# Patient Record
Sex: Male | Born: 1990 | Race: Black or African American | Hispanic: No | Marital: Married | Smoking: Never smoker
Health system: Southern US, Community
[De-identification: ages and names within clinical notes are randomized; demographics above are authoritative.]

---

## 2012-08-21 ENCOUNTER — Emergency Department: Payer: Self-pay | Admitting: Emergency Medicine

## 2012-08-21 LAB — URINALYSIS, COMPLETE
Bacteria: NONE SEEN
Bilirubin,UR: NEGATIVE
Glucose,UR: NEGATIVE mg/dL (ref 0–75)
Ketone: NEGATIVE
Leukocyte Esterase: NEGATIVE
Nitrite: NEGATIVE
Specific Gravity: 1.019 (ref 1.003–1.030)
Squamous Epithelial: 1
WBC UR: 2 /HPF (ref 0–5)

## 2013-10-24 ENCOUNTER — Emergency Department: Payer: Self-pay | Admitting: Emergency Medicine

## 2015-07-22 ENCOUNTER — Encounter: Payer: Self-pay | Admitting: Emergency Medicine

## 2015-07-22 ENCOUNTER — Emergency Department
Admission: EM | Admit: 2015-07-22 | Discharge: 2015-07-22 | Disposition: A | Discharge status: Home / Self Care | Payer: Self-pay | Attending: Emergency Medicine | Admitting: Emergency Medicine

## 2015-07-22 ENCOUNTER — Emergency Department: Payer: Self-pay

## 2015-07-22 DIAGNOSIS — W1642XA Fall into unspecified water causing other injury, initial encounter: Secondary | ICD-10-CM | POA: Insufficient documentation

## 2015-07-22 DIAGNOSIS — Y998 Other external cause status: Secondary | ICD-10-CM | POA: Insufficient documentation

## 2015-07-22 DIAGNOSIS — S8262XA Displaced fracture of lateral malleolus of left fibula, initial encounter for closed fracture: Secondary | ICD-10-CM | POA: Insufficient documentation

## 2015-07-22 DIAGNOSIS — Y939 Activity, unspecified: Secondary | ICD-10-CM | POA: Insufficient documentation

## 2015-07-22 DIAGNOSIS — Y929 Unspecified place or not applicable: Secondary | ICD-10-CM | POA: Insufficient documentation

## 2015-07-22 MED ORDER — OXYCODONE-ACETAMINOPHEN 5-325 MG PO TABS: 1.0000 | ORAL_TABLET | ORAL | Status: AC | PRN

## 2015-07-22 NOTE — ED Notes (Signed)
C/o right ankle pain x 3 days.

## 2015-07-22 NOTE — ED Provider Notes (Signed)
[facility] Emergency Department Provider Note  ____________________________________________  Time seen: Approximately 10:23 AM  I have reviewed the triage vital signs and the nursing notes.   HISTORY  Chief Complaint Ankle Pain    HPI Lucas Mayo is a 25 y.o. male who states that he fell into a pond about 4 days ago twisted his ankle. Patient complains of swelling and bruising noted to the lateral aspect of his right ankle. Able to bear weight but very methodically. Describes his pain as nonradiating and 7/10 at this time. Has tried ibuprofen and Tylenol over-the-counter with no relief.   History reviewed. No pertinent past medical history.  There are no active problems to display for this patient.   History reviewed. No pertinent past surgical history.  Current Outpatient Rx  Name  Route  Sig  Dispense  Refill  . oxyCODONE-acetaminophen (ROXICET) 5-325 MG tablet   Oral   Take 1-2 tablets by mouth every 4 (four) hours as needed for severe pain.   15 tablet   0     Allergies Review of patient's allergies indicates no known allergies.  No family history on file.  Social History Social History  Substance Use Topics  . Smoking status: Never Smoker   . Smokeless tobacco: None  . Alcohol Use: None    Review of Systems Constitutional: No fever/chills Musculoskeletal: Negative for back pain. Skin: Negative for rash. Neurological: Negative for headaches, focal weakness or numbness.  10-point ROS otherwise negative.  ____________________________________________   PHYSICAL EXAM:  VITAL SIGNS: ED Triage Vitals  Enc Vitals Group     BP 07/22/15 0929 147/76 mmHg     Pulse Rate 07/22/15 0929 78     Resp 07/22/15 0929 15     Temp 07/22/15 0929 98.5 F (36.9 C)     Temp Source 07/22/15 0929 Oral     SpO2 07/22/15 0929 100 %     Weight 07/22/15 0929 168 lb (76.204 kg)     Height 07/22/15 0929  (1.88 m)     Head Cir --       Peak Flow --      Pain Score 07/22/15 0927 7     Pain Loc --      Pain Edu? --      Excl. in GC? --     Constitutional: Alert and oriented. Well appearing and in no acute distress. Cardiovascular: Normal rate, regular rhythm. Grossly normal heart sounds.  Good peripheral circulation. Respiratory: Normal respiratory effort.  No retractions. Lungs CTAB. Musculoskeletal: Positive edema and bruising noted to the lateral aspect of the right ankle. Distally neurovascularly intact. Neurologic:  Normal speech and language. No gross focal neurologic deficits are appreciated. No gait instability. Skin:  Skin is warm, dry and intact. No rash noted. Psychiatric: Mood and affect are normal. Speech and behavior are normal.  ____________________________________________   LABS (all labs ordered are listed, but only abnormal results are displayed)  Labs Reviewed - No data to display ____________________________________________  EKG   ____________________________________________  RADIOLOGY  IMPRESSION: Small ossific fragment distal to the lateral malleolus with severe overlying soft tissue swelling consistent with acute avulsive injury. ____________________________________________   PROCEDURES  Procedure(s) performed: None  Critical Care performed: No  ____________________________________________   INITIAL IMPRESSION / ASSESSMENT AND PLAN / ED COURSE  Pertinent labs & imaging results that were available during my care of the patient were reviewed by me and considered in my medical decision making (see chart for details).  Acute avulsive fracture of the lateral distal medial malleolus. Patient is placed in a posterior splint crutches provided and he is to follow-up with orthopedics on call this week for further orthopedic care. Rx given for Percocet 5/325 and a work excuse given as well. ____________________________________________   FINAL CLINICAL IMPRESSION(S) / ED  DIAGNOSES  Final diagnoses:  Closed low lateral malleolus fracture, left, initial encounter     This chart was dictated using voice recognition software/Dragon. Despite best efforts to proofread, errors can occur which can change the meaning. Any change was purely unintentional.    Evangeline Dakinharles M Maui Ahart, PA-C 07/22/15 1045  Myrna Blazeravid Matthew Schaevitz, MD 07/22/15 1630

## 2015-07-22 NOTE — Discharge Instructions (Signed)
Ankle Fracture °A fracture is a break in a bone. The ankle joint is made up of three bones. These include the lower (distal) sections of your lower leg bones, called the tibia and fibula, along with a bone in your foot, called the talus. Depending on how bad the break is and if more than one ankle joint bone is broken, a cast or splint is used to protect and keep your injured bone from moving while it heals. Sometimes, surgery is required to help the fracture heal properly.  °There are two general types of fractures: °· Stable fracture. This includes a single fracture line through one bone, with no injury to ankle ligaments. A fracture of the talus that does not have any displacement (movement of the bone on either side of the fracture line) is also stable. °· Unstable fracture. This includes more than one fracture line through one or more bones in the ankle joint. It also includes fractures that have displacement of the bone on either side of the fracture line. °CAUSES °· A direct blow to the ankle.   °· Quickly and severely twisting your ankle. °· Trauma, such as a car accident or falling from a significant height. °RISK FACTORS °You may be at a higher risk of ankle fracture if: °· You have certain medical conditions. °· You are involved in high-impact sports. °· You are involved in a high-impact car accident. °SIGNS AND SYMPTOMS  °· Tender and swollen ankle. °· Bruising around the injured ankle. °· Pain on movement of the ankle. °· Difficulty walking or putting weight on the ankle. °· A cold foot below the site of the ankle injury. This can occur if the blood vessels passing through your injured ankle were also damaged. °· Numbness in the foot below the site of the ankle injury. °DIAGNOSIS  °An ankle fracture is usually diagnosed with a physical exam and X-rays. A CT scan may also be required for complex fractures. °TREATMENT  °Stable fractures are treated with a cast or splint and using crutches to avoid putting  weight on your injured ankle. This is followed by an ankle strengthening program. Some patients require a special type of cast, depending on other medical problems they may have. Unstable fractures require surgery to ensure the bones heal properly. Your health care provider will tell you what type of fracture you have and the best treatment for your condition. °HOME CARE INSTRUCTIONS  °· Review correct crutch use with your health care provider and use your crutches as directed. Safe use of crutches is extremely important. Misuse of crutches can cause you to fall or cause injury to nerves in your hands or armpits. °· Do not put weight or pressure on the injured ankle until directed by your health care provider. °· To lessen the swelling, keep the injured leg elevated while sitting or lying down. °· Apply ice to the injured area: °¨ Put ice in a plastic bag. °¨ Place a towel between your cast and the bag. °¨ Leave the ice on for 20 minutes, 2-3 times a day. °· If you have a plaster or fiberglass cast: °¨ Do not try to scratch the skin under the cast with any objects. This can increase your risk of skin infection. °¨ Check the skin around the cast every day. You may put lotion on any red or sore areas. °¨ Keep your cast dry and clean. °· If you have a plaster splint: °¨ Wear the splint as directed. °¨ You may loosen the elastic   around the splint if your toes become numb, tingle, or turn cold or blue. °· Do not put pressure on any part of your cast or splint; it may break. Rest your cast only on a pillow the first 24 hours until it is fully hardened. °· Your cast or splint can be protected during bathing with a plastic bag sealed to your skin with medical tape. Do not lower the cast or splint into water. °· Take medicines as directed by your health care provider. Only take over-the-counter or prescription medicines for pain, discomfort, or fever as directed by your health care provider. °· Do not drive a vehicle until  your health care provider specifically tells you it is safe to do so. °· If your health care provider has given you a follow-up appointment, it is very important to keep that appointment. Not keeping the appointment could result in a chronic or permanent injury, pain, and disability. If you have any problem keeping the appointment, call the facility for assistance. °SEEK MEDICAL CARE IF: °You develop increased swelling or discomfort. °SEEK IMMEDIATE MEDICAL CARE IF:  °· Your cast gets damaged or breaks. °· You have continued severe pain. °· You develop new pain or swelling after the cast was put on. °· Your skin or toenails below the injury turn blue or gray. °· Your skin or toenails below the injury feel cold, numb, or have loss of sensitivity to touch. °· There is a bad smell or pus draining from under the cast. °MAKE SURE YOU:  °· Understand these instructions. °· Will watch your condition. °· Will get help right away if you are not doing well or get worse. °  °This information is not intended to replace advice given to you by your health care provider. Make sure you discuss any questions you have with your health care provider. °  °Document Released: 03/19/2000 Document Revised: 03/27/2013 Document Reviewed: 10/19/2012 °Elsevier Interactive Patient Education ©2016 [employer] ° °Cast or Splint Care °Casts and splints support injured limbs and keep bones from moving while they heal. It is important to care for your cast or splint at home.   °HOME CARE INSTRUCTIONS °· Keep the cast or splint uncovered during the drying period. It can take 24 to 48 hours to dry if it is made of plaster. A fiberglass cast will dry in less than 1 hour. °· Do not rest the cast on anything harder than a pillow for the first 24 hours. °· Do not put weight on your injured limb or apply pressure to the cast until your health care provider gives you permission. °· Keep the cast or splint dry. Wet casts or splints can lose their shape and  may not support the limb as well. A wet cast that has lost its shape can also create harmful pressure on your skin when it dries. Also, wet skin can become infected. °¨ Cover the cast or splint with a plastic bag when bathing or when out in the rain or snow. If the cast is on the trunk of the body, take sponge baths until the cast is removed. °¨ If your cast does become wet, dry it with a towel or a blow dryer on the cool setting only. °· Keep your cast or splint clean. Soiled casts may be wiped with a moistened cloth. °· Do not place any hard or soft foreign objects under your cast or splint, such as cotton, toilet paper, lotion, or powder. °· Do not try to scratch the   skin under the cast with any object. The object could get stuck inside the cast. Also, scratching could lead to an infection. If itching is a problem, use a blow dryer on a cool setting to relieve discomfort. °· Do not trim or cut your cast or remove padding from inside of it. °· Exercise all joints next to the injury that are not immobilized by the cast or splint. For example, if you have a long leg cast, exercise the hip joint and toes. If you have an arm cast or splint, exercise the shoulder, elbow, thumb, and fingers. °· Elevate your injured arm or leg on 1 or 2 pillows for the first 1 to 3 days to decrease swelling and pain. It is best if you can comfortably elevate your cast so it is higher than your heart. °SEEK MEDICAL CARE IF:  °· Your cast or splint cracks. °· Your cast or splint is too tight or too loose. °· You have unbearable itching inside the cast. °· Your cast becomes wet or develops a soft spot or area. °· You have a bad smell coming from inside your cast. °· You get an object stuck under your cast. °· Your skin around the cast becomes red or raw. °· You have new pain or worsening pain after the cast has been applied. °SEEK IMMEDIATE MEDICAL CARE IF:  °· You have fluid leaking through the cast. °· You are unable to move your fingers  or toes. °· You have discolored (blue or white), cool, painful, or very swollen fingers or toes beyond the cast. °· You have tingling or numbness around the injured area. °· You have severe pain or pressure under the cast. °· You have any difficulty with your breathing or have shortness of breath. °· You have chest pain. °  °This information is not intended to replace advice given to you by your health care provider. Make sure you discuss any questions you have with your health care provider. °  °Document Released: 03/19/2000 Document Revised: 01/10/2013 Document Reviewed: 09/28/2012 °Elsevier Interactive Patient Education ©2016 [employer] ° °

## 2015-07-22 NOTE — ED Notes (Signed)
States he fell into a pond about 4 days ago  Twisted ankle  Swelling and bruising noted   Positive pulses and good sensation

## 2015-12-02 IMAGING — CR RIGHT FOOT COMPLETE - 3+ VIEW
1 series · 3 of 3 positions shown · non-contrast
Comparison: None.

CLINICAL DATA: 23-year-old male status post blunt trauma with first
metatarsal pain radiating to the dorsum. Initial encounter.

EXAM:
RIGHT FOOT COMPLETE - 3+ VIEW

[Series 1: ap · 0.17mm/px · 3 of 3 slices shown]
[im 1/3]
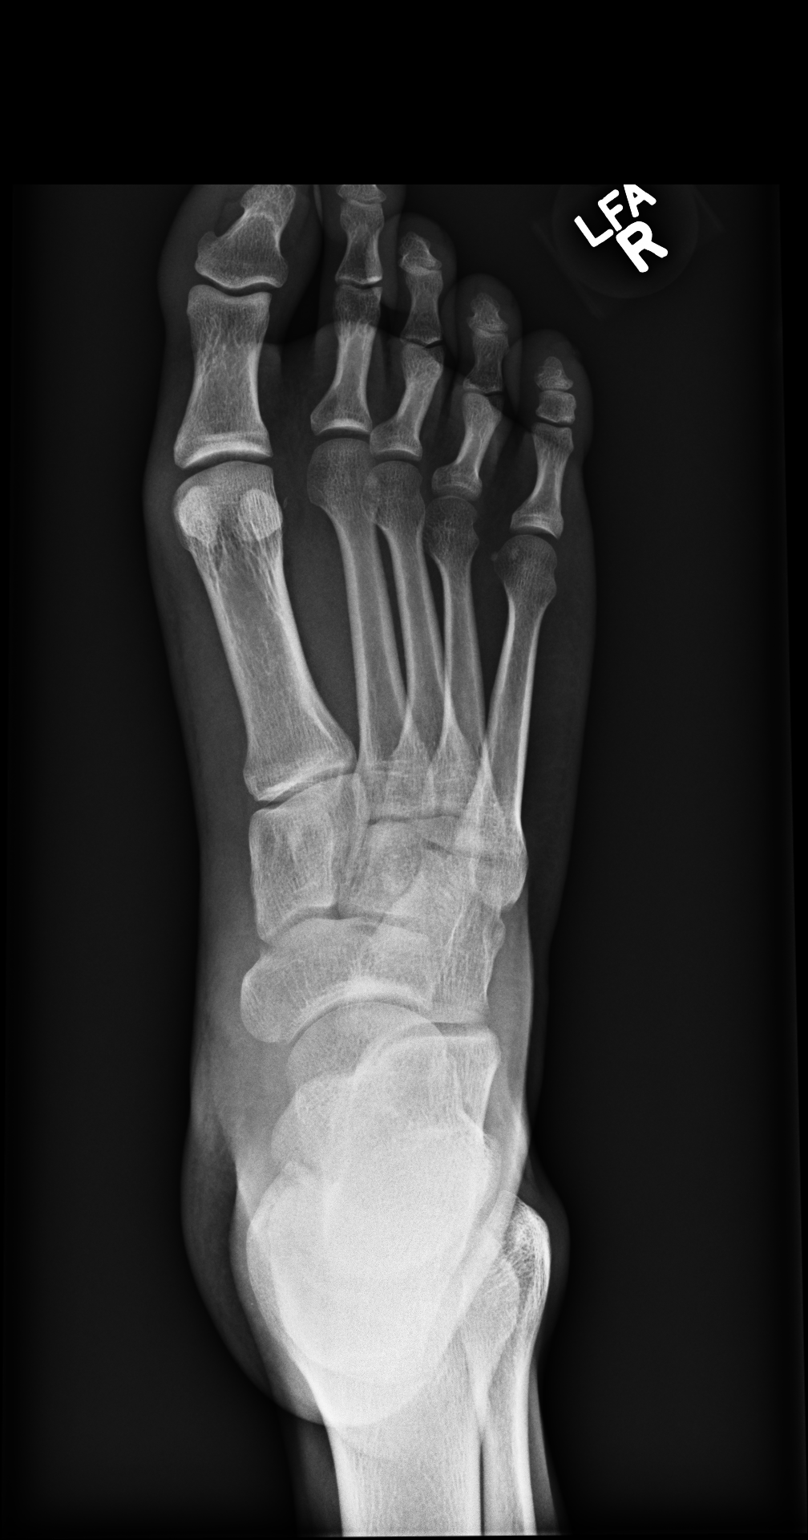
[im 2/3]
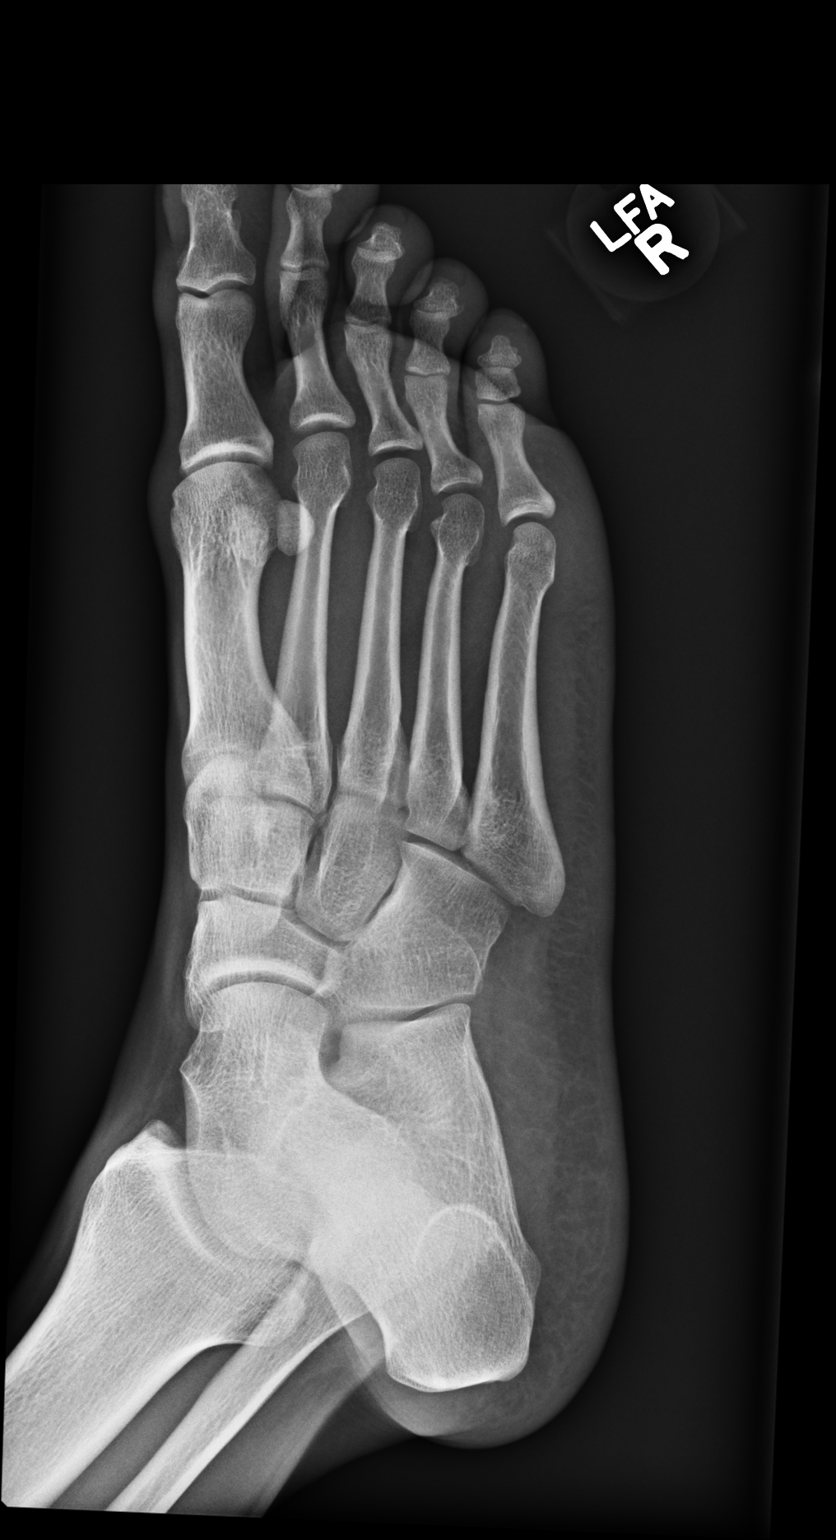
[im 3/3]
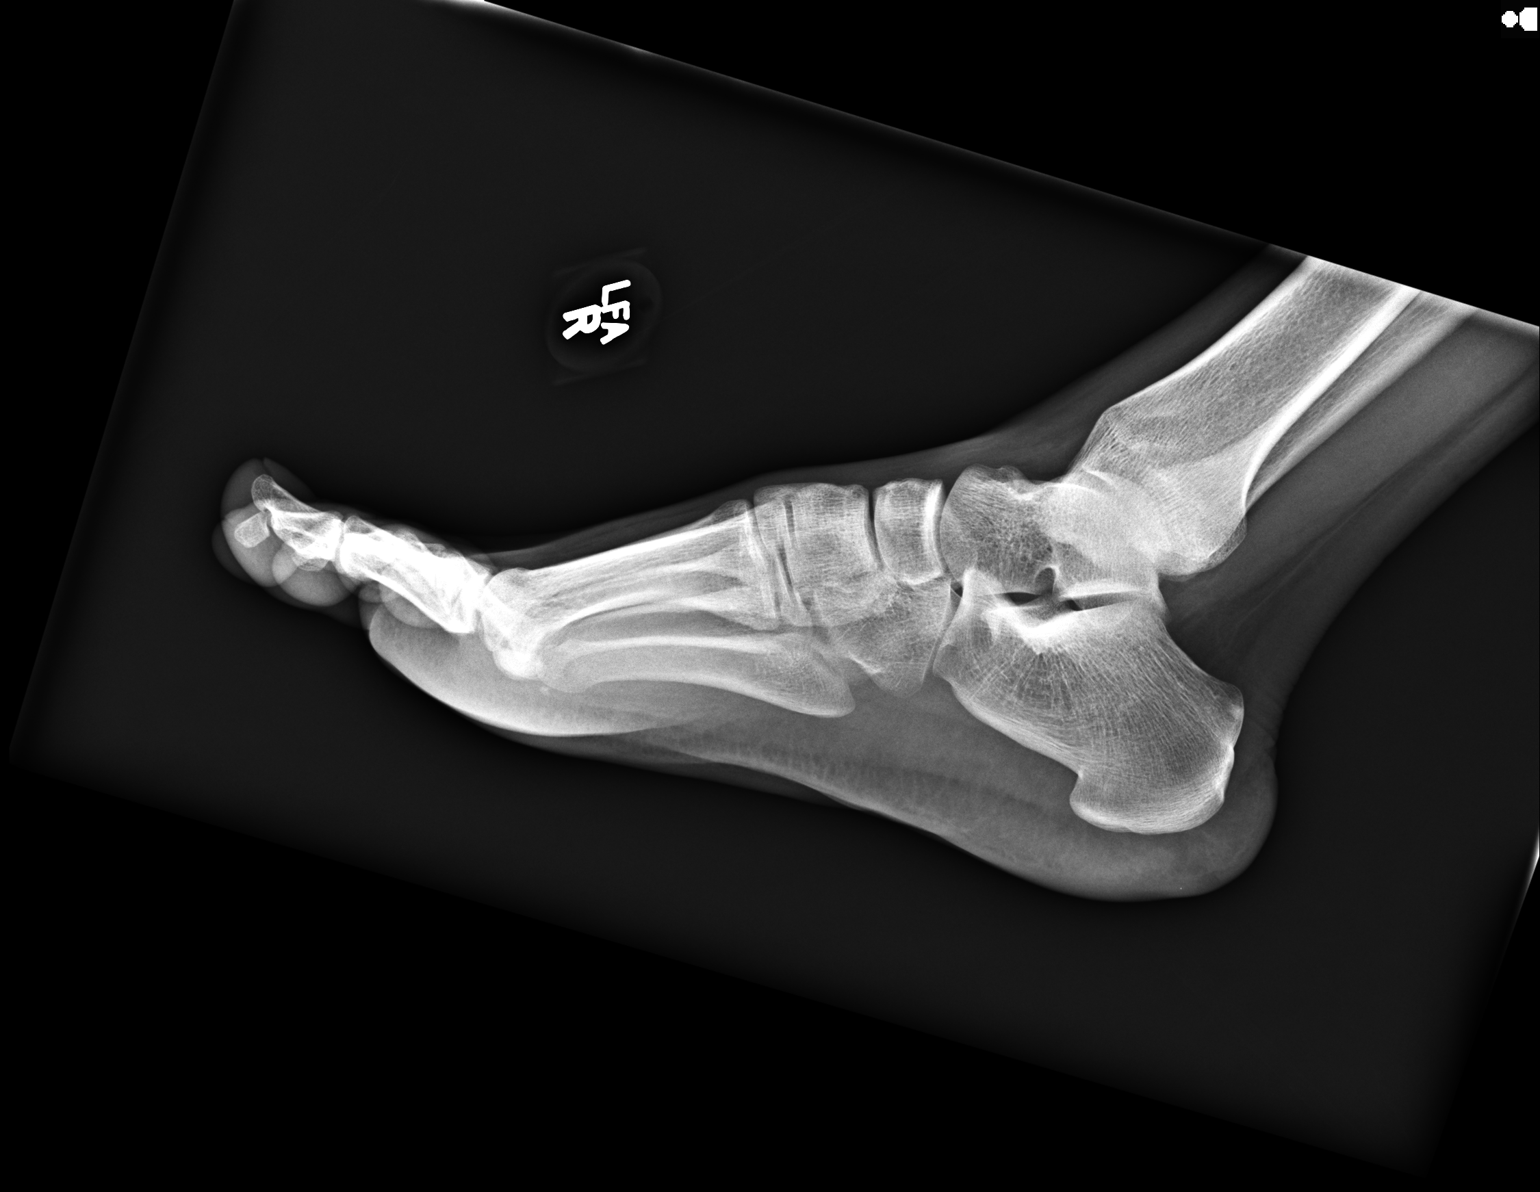

[3 of 3 positions shown; findings below may reference images not displayed]

FINDINGS: Bone mineralization is within normal limits. Calcaneus intact. Joint
spaces and alignment preserved. No acute fracture or dislocation.
IMPRESSION: No acute fracture or dislocation identified about the right foot.
